# Patient Record
Sex: Male | Born: 2005 | Race: White | Hispanic: No | Marital: Single | Smoking: Never smoker
Health system: Southern US, Community
[De-identification: ages and names within clinical notes are randomized; demographics above are authoritative.]

## PROBLEM LIST (undated history)

## (undated) DIAGNOSIS — G43909 Migraine, unspecified, not intractable, without status migrainosus: Secondary | ICD-10-CM

## (undated) HISTORY — PX: APPENDECTOMY: SHX54

---
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
## 2023-01-28 DIAGNOSIS — F411 Generalized anxiety disorder: Secondary | ICD-10-CM | POA: Diagnosis not present

## 2023-01-28 DIAGNOSIS — F40248 Other situational type phobia: Secondary | ICD-10-CM | POA: Diagnosis not present

## 2023-01-28 DIAGNOSIS — F900 Attention-deficit hyperactivity disorder, predominantly inattentive type: Secondary | ICD-10-CM | POA: Diagnosis not present

## 2023-02-25 DIAGNOSIS — F40248 Other situational type phobia: Secondary | ICD-10-CM | POA: Diagnosis not present

## 2023-02-25 DIAGNOSIS — F411 Generalized anxiety disorder: Secondary | ICD-10-CM | POA: Diagnosis not present

## 2023-02-25 DIAGNOSIS — F900 Attention-deficit hyperactivity disorder, predominantly inattentive type: Secondary | ICD-10-CM | POA: Diagnosis not present

## 2023-03-19 DIAGNOSIS — F900 Attention-deficit hyperactivity disorder, predominantly inattentive type: Secondary | ICD-10-CM | POA: Diagnosis not present

## 2023-03-19 DIAGNOSIS — F411 Generalized anxiety disorder: Secondary | ICD-10-CM | POA: Diagnosis not present

## 2023-03-19 DIAGNOSIS — F40248 Other situational type phobia: Secondary | ICD-10-CM | POA: Diagnosis not present

## 2023-03-25 DIAGNOSIS — F40248 Other situational type phobia: Secondary | ICD-10-CM | POA: Diagnosis not present

## 2023-03-25 DIAGNOSIS — F411 Generalized anxiety disorder: Secondary | ICD-10-CM | POA: Diagnosis not present

## 2023-03-25 DIAGNOSIS — F900 Attention-deficit hyperactivity disorder, predominantly inattentive type: Secondary | ICD-10-CM | POA: Diagnosis not present

## 2023-05-03 DIAGNOSIS — F411 Generalized anxiety disorder: Secondary | ICD-10-CM | POA: Diagnosis not present

## 2023-05-03 DIAGNOSIS — F900 Attention-deficit hyperactivity disorder, predominantly inattentive type: Secondary | ICD-10-CM | POA: Diagnosis not present

## 2023-05-03 DIAGNOSIS — F40248 Other situational type phobia: Secondary | ICD-10-CM | POA: Diagnosis not present

## 2023-05-04 DIAGNOSIS — F40248 Other situational type phobia: Secondary | ICD-10-CM | POA: Diagnosis not present

## 2023-05-04 DIAGNOSIS — F900 Attention-deficit hyperactivity disorder, predominantly inattentive type: Secondary | ICD-10-CM | POA: Diagnosis not present

## 2023-05-04 DIAGNOSIS — F411 Generalized anxiety disorder: Secondary | ICD-10-CM | POA: Diagnosis not present

## 2023-05-05 ENCOUNTER — Other Ambulatory Visit: Payer: Self-pay

## 2023-05-05 ENCOUNTER — Encounter (HOSPITAL_BASED_OUTPATIENT_CLINIC_OR_DEPARTMENT_OTHER): Payer: Self-pay

## 2023-05-05 ENCOUNTER — Emergency Department (HOSPITAL_BASED_OUTPATIENT_CLINIC_OR_DEPARTMENT_OTHER)
Admission: EM | Admit: 2023-05-05 | Discharge: 2023-05-05 | Disposition: A | Discharge status: Home / Self Care | Payer: Federal, State, Local not specified - PPO | Attending: Emergency Medicine | Admitting: Emergency Medicine

## 2023-05-05 ENCOUNTER — Emergency Department (HOSPITAL_BASED_OUTPATIENT_CLINIC_OR_DEPARTMENT_OTHER): Payer: Federal, State, Local not specified - PPO

## 2023-05-05 DIAGNOSIS — S060X1A Concussion with loss of consciousness of 30 minutes or less, initial encounter: Secondary | ICD-10-CM | POA: Diagnosis not present

## 2023-05-05 DIAGNOSIS — W19XXXA Unspecified fall, initial encounter: Secondary | ICD-10-CM | POA: Insufficient documentation

## 2023-05-05 DIAGNOSIS — R55 Syncope and collapse: Secondary | ICD-10-CM | POA: Diagnosis not present

## 2023-05-05 DIAGNOSIS — R001 Bradycardia, unspecified: Secondary | ICD-10-CM | POA: Diagnosis not present

## 2023-05-05 DIAGNOSIS — S0990XA Unspecified injury of head, initial encounter: Secondary | ICD-10-CM | POA: Diagnosis not present

## 2023-05-05 LAB — BASIC METABOLIC PANEL
Anion gap: 9 (ref 5–15)
BUN: 22 mg/dL — ABNORMAL HIGH (ref 6–20)
CO2: 24 mmol/L (ref 22–32)
Calcium: 9.8 mg/dL (ref 8.9–10.3)
Chloride: 105 mmol/L (ref 98–111)
Creatinine, Ser: 1 mg/dL (ref 0.61–1.24)
GFR, Estimated: >60 mL/min (ref >60)
Glucose, Bld: 115 mg/dL — ABNORMAL HIGH (ref 70–99)
Potassium: 3.8 mmol/L (ref 3.5–5.1)
Sodium: 138 mmol/L (ref 135–145)

## 2023-05-05 LAB — CBC
HCT: 41.1 % (ref 39.0–52.0)
Hemoglobin: 14.3 g/dL (ref 13.0–17.0)
MCH: 29 pg (ref 26.0–34.0)
MCHC: 34.8 g/dL (ref 30.0–36.0)
MCV: 83.4 fL (ref 80.0–100.0)
Platelets: 191 10*3/uL (ref 150–400)
RBC: 4.93 MIL/uL (ref 4.22–5.81)
RDW: 12.5 % (ref 11.5–15.5)
WBC: 7.4 10*3/uL (ref 4.0–10.5)
nRBC: 0 % (ref 0.0–0.2)

## 2023-05-05 NOTE — ED Triage Notes (Signed)
Pt c/o concussion, states he "blacked out & hit head." Dad states that after dinner last night, he c/o CP/ "needed to burp." Dad found him slumped over utility sink, syncopal episode & hit head on floor. Approx 10sec of unresponsive, but pt w continued HA today

## 2023-05-05 NOTE — ED Provider Notes (Signed)
[place] EMERGENCY DEPARTMENT AT [facility] Provider Note   CSN: [id number] Arrival date & time: 05/05/23  1436     History No chief complaint on file.   Zachary Rose is a 17 y.o. male patient who presents to the emergency department today for further evaluation of a headache.  Patient states that he was eating dinner yesterday when he had a severe pain in his chest which lasted approximately 10 seconds.  He states the pain became severe to the point where he ended up losing consciousness briefly and he fell backwards and hit his head.  Since then he has been more somnolent than usual per the father at the bedside and complaining of a headache.  Patient has not had any additional episodes of chest pain later after the incident yesterday or today.  He denies nausea or vomiting.  HPI     Home Medications Prior to Admission medications   Medication Sig Start Date End Date Taking? Authorizing Provider  amitriptyline (ELAVIL) 25 MG tablet Take 1 tablet (25 mg total) by mouth at bedtime. 01/22/21   Keturah Shavers, MD  benzonatate (TESSALON) 100 MG capsule Take 1 capsule (100 mg total) by mouth every 8 (eight) hours. Patient not taking: No sig reported 09/08/20   Hall-Potvin, [place], PA-C  Coenzyme Q10 (COQ10) 150 MG CAPS Take once daily Patient not taking: Reported on 01/22/2021 10/29/20   Keturah Shavers, MD  dicyclomine (BENTYL) 10 MG capsule Take 10 mg by mouth 4 (four) times daily -  before meals and at bedtime. Patient not taking: Reported on 01/22/2021    [provider]  Magnesium Oxide 500 MG TABS Take 1 tablet (500 mg total) by mouth daily. Patient not taking: Reported on 01/22/2021 10/29/20   Keturah Shavers, MD  naproxen (NAPROSYN) 500 MG tablet Take 500 mg by mouth 2 (two) times daily with a meal. Patient not taking: Reported on 01/22/2021    [provider]  promethazine (PHENERGAN) 25 MG tablet Take 25 mg by mouth every 6 (six) hours as needed  for nausea or vomiting. Patient not taking: Reported on 01/22/2021    [provider]  rizatriptan (MAXALT) 10 MG tablet Take 10 mg by mouth as needed for migraine. May repeat in 2 hours if needed    [provider]  SUMAtriptan (IMITREX) 50 MG tablet Take 1 tablet with 400 mg of ibuprofen for moderate to severe headache, maximum 2 or 3 times a week 01/22/21   Keturah Shavers, MD  topiramate (TOPAMAX) 25 MG tablet 2 tablets twice daily for 2 weeks then 3 tablets twice daily 01/22/21   Keturah Shavers, MD  cetirizine (ZYRTEC ALLERGY) 10 MG tablet Take 1 tablet (10 mg total) by mouth daily. 09/08/20 10/22/20  Hall-Potvin, [place], PA-C  fluticasone (FLONASE) 50 MCG/ACT nasal spray Place 1 spray into both nostrils daily. 09/08/20 10/22/20  Hall-Potvin, [place], PA-C  omeprazole (PRILOSEC) 20 MG capsule Take 20 mg by mouth as needed.  05/01/12 09/08/20  [provider]  SUMAtriptan (IMITREX) 20 MG/ACT nasal spray Place 1 spray into 1 nostril at onset of migraine. May repeat in 2 hours if headache persists or recurs. Patient not taking: No sig reported 01/08/19 10/22/20  Elveria Rising, NP      Allergies    Patient has no known allergies.    Review of Systems   Review of Systems  All other systems reviewed and are negative.   Physical Exam Updated Vital Signs BP 125/79   Pulse Marland Kitchen)  51   Temp 98 F (36.7 C) (Oral)   Resp 19   SpO2 98%  Physical Exam Vitals and nursing note reviewed.  Constitutional:      Appearance: Normal appearance.  HENT:     Head: Normocephalic and atraumatic.  Eyes:     General:        Right eye: No discharge.        Left eye: No discharge.     Conjunctiva/sclera: Conjunctivae normal.  Pulmonary:     Effort: Pulmonary effort is normal.  Skin:    General: Skin is warm and dry.     Findings: No rash.  Neurological:     General: No focal deficit present.     Mental Status: He is alert.     Comments: Cranial nerves II through XII are  intact.  5/5 strength to the upper and lower extremities.  Normal sensation to the upper and lower extremities.  Extraocular movements are intact without any obvious entrapment.  Psychiatric:        Mood and Affect: Mood normal.        Behavior: Behavior normal.     ED Results / Procedures / Treatments   Labs (all labs ordered are listed, but only abnormal results are displayed) Labs Reviewed  BASIC METABOLIC PANEL - Abnormal; Notable for the following components:      Result Value   Glucose, Bld 115 (*)    BUN 22 (*)    All other components within normal limits  CBC    EKG None  Radiology CT Head Wo Contrast  Result Date: 05/05/2023 CLINICAL DATA:  Provided history: Head trauma, moderate/severe. Additional history provided: "Blackout" episode with head trauma. EXAM: CT HEAD WITHOUT CONTRAST TECHNIQUE: Contiguous axial images were obtained from the base of the skull through the vertex without intravenous contrast. RADIATION DOSE REDUCTION: This exam was performed according to the departmental dose-optimization program which includes automated exposure control, adjustment of the mA and/or kV according to patient size and/or use of iterative reconstruction technique. COMPARISON:  Brain MRI 12/08/2020. FINDINGS: Brain: Cerebral volume is normal. There is no acute intracranial hemorrhage. No demarcated cortical infarct. No extra-axial fluid collection. No evidence of an intracranial mass. No midline shift. Vascular: No hyperdense vessel. Skull: No calvarial fracture or aggressive osseous lesion. Sinuses/Orbits: No mass or acute finding within the imaged orbits. No significant paranasal sinus disease. IMPRESSION: No evidence of an acute intracranial abnormality. Electronically Signed   By: Jackey Loge D.O.   On: 05/05/2023 16:59    Procedures Procedures    Medications Ordered in ED Medications - No data to display  ED Course/ Medical Decision Making/ A&P Clinical Course as of 05/05/23  1728  Thu May 05, 2023  1724 On reevaluation, patient feeling about the same.  I educated the patient and father at the bedside about postconcussive syndrome including signs, symptoms, and overall treatment.  They expressed full understanding.  I am going to give the patient a school note for the next week.  Strict return precautions were discussed.  He is safe for discharge. [CF]  1725 CBC Normal.  [CF]  1725 Basic metabolic panel(!) Normal.  [CF]  1725 CT Head Wo Contrast I personally ordered and interpreted this study and do not see any evidence of intracranial pathology. I do agree with the radiologist interpretation.  [CF]    Clinical Course User Index [CF] Teressa Lower, PA-C   {   Click here for ABCD2, HEART and other calculators  Medical Decision Making ALFIO ALLSUP is a 17 y.o. male patient who presents to the emergency department today for further evaluation of possible concussion.  Signs and symptoms do certainly coincide with a concussion.  Considering the patient's age I still used PECARN which she was positive considering his overall age and somnolence per the father.  CT scan was normal.  Will treat conservatively for concussion including NSAIDs, limited activity at school including screen time.  Patient and father agreeable with plan.  Strict return precautions were discussed.  He is safe for discharge at this time.   Amount and/or Complexity of Data Reviewed Labs: ordered. Decision-making details documented in ED Course. Radiology: ordered. Decision-making details documented in ED Course.   Final Clinical Impression(s) / ED Diagnoses Final diagnoses:  Concussion with loss of consciousness of 30 minutes or less, initial encounter    Rx / DC Orders ED Discharge Orders     None         Teressa Lower, PA-C 05/05/23 1728    Rozelle Logan, DO 05/05/23 2237

## 2023-05-05 NOTE — ED Notes (Signed)
Pt verbalized understanding of d/c instructions, meds, and followup care. Denies questions. VSS, no distress noted. Steady gait to exit with all belongings.  ?

## 2023-05-05 NOTE — Discharge Instructions (Addendum)
As we discussed, this is likely a concussion from when you lost consciousness and hit your head.  This will resolve with time.  Please monitor your symptoms and adjust your daily activities accordingly.  You can take Tylenol and or ibuprofen for the headache symptoms.  Please return to the emergency department for any worsening symptoms.

## 2023-05-20 DIAGNOSIS — Z0000 Encounter for general adult medical examination without abnormal findings: Secondary | ICD-10-CM | POA: Diagnosis not present

## 2023-06-07 DIAGNOSIS — F411 Generalized anxiety disorder: Secondary | ICD-10-CM | POA: Diagnosis not present

## 2023-06-07 DIAGNOSIS — F40248 Other situational type phobia: Secondary | ICD-10-CM | POA: Diagnosis not present

## 2023-06-07 DIAGNOSIS — F900 Attention-deficit hyperactivity disorder, predominantly inattentive type: Secondary | ICD-10-CM | POA: Diagnosis not present

## 2023-06-21 DIAGNOSIS — F900 Attention-deficit hyperactivity disorder, predominantly inattentive type: Secondary | ICD-10-CM | POA: Diagnosis not present

## 2023-06-21 DIAGNOSIS — F40248 Other situational type phobia: Secondary | ICD-10-CM | POA: Diagnosis not present

## 2023-06-21 DIAGNOSIS — F411 Generalized anxiety disorder: Secondary | ICD-10-CM | POA: Diagnosis not present

## 2023-07-05 DIAGNOSIS — F411 Generalized anxiety disorder: Secondary | ICD-10-CM | POA: Diagnosis not present

## 2023-07-05 DIAGNOSIS — F40248 Other situational type phobia: Secondary | ICD-10-CM | POA: Diagnosis not present

## 2023-07-05 DIAGNOSIS — F900 Attention-deficit hyperactivity disorder, predominantly inattentive type: Secondary | ICD-10-CM | POA: Diagnosis not present

## 2023-08-02 DIAGNOSIS — F411 Generalized anxiety disorder: Secondary | ICD-10-CM | POA: Diagnosis not present

## 2023-08-02 DIAGNOSIS — F900 Attention-deficit hyperactivity disorder, predominantly inattentive type: Secondary | ICD-10-CM | POA: Diagnosis not present

## 2023-08-02 DIAGNOSIS — F40248 Other situational type phobia: Secondary | ICD-10-CM | POA: Diagnosis not present

## 2023-09-05 DIAGNOSIS — F40248 Other situational type phobia: Secondary | ICD-10-CM | POA: Diagnosis not present

## 2023-09-05 DIAGNOSIS — F411 Generalized anxiety disorder: Secondary | ICD-10-CM | POA: Diagnosis not present

## 2023-09-05 DIAGNOSIS — F900 Attention-deficit hyperactivity disorder, predominantly inattentive type: Secondary | ICD-10-CM | POA: Diagnosis not present

## 2023-09-23 DIAGNOSIS — F411 Generalized anxiety disorder: Secondary | ICD-10-CM | POA: Diagnosis not present

## 2023-10-07 DIAGNOSIS — F40248 Other situational type phobia: Secondary | ICD-10-CM | POA: Diagnosis not present

## 2023-10-07 DIAGNOSIS — F900 Attention-deficit hyperactivity disorder, predominantly inattentive type: Secondary | ICD-10-CM | POA: Diagnosis not present

## 2023-10-07 DIAGNOSIS — F411 Generalized anxiety disorder: Secondary | ICD-10-CM | POA: Diagnosis not present

## 2023-11-04 DIAGNOSIS — F411 Generalized anxiety disorder: Secondary | ICD-10-CM | POA: Diagnosis not present

## 2023-12-02 DIAGNOSIS — F411 Generalized anxiety disorder: Secondary | ICD-10-CM | POA: Diagnosis not present

## 2024-01-27 DIAGNOSIS — F411 Generalized anxiety disorder: Secondary | ICD-10-CM | POA: Diagnosis not present

## 2024-04-20 DIAGNOSIS — F411 Generalized anxiety disorder: Secondary | ICD-10-CM | POA: Diagnosis not present

## 2024-04-21 ENCOUNTER — Encounter: Payer: Self-pay | Admitting: Emergency Medicine

## 2024-04-21 ENCOUNTER — Ambulatory Visit
Admission: EM | Admit: 2024-04-21 | Discharge: 2024-04-21 | Disposition: A | Discharge status: Home / Self Care | Attending: Emergency Medicine | Admitting: Emergency Medicine

## 2024-04-21 ENCOUNTER — Ambulatory Visit

## 2024-04-21 DIAGNOSIS — R509 Fever, unspecified: Secondary | ICD-10-CM | POA: Diagnosis not present

## 2024-04-21 DIAGNOSIS — J069 Acute upper respiratory infection, unspecified: Secondary | ICD-10-CM

## 2024-04-21 DIAGNOSIS — R059 Cough, unspecified: Secondary | ICD-10-CM | POA: Diagnosis not present

## 2024-04-21 LAB — POC COVID19/FLU A&B COMBO
Covid Antigen, POC: NEGATIVE
Influenza A Antigen, POC: NEGATIVE
Influenza B Antigen, POC: NEGATIVE

## 2024-04-21 LAB — POCT RAPID STREP A (OFFICE): Rapid Strep A Screen: NEGATIVE

## 2024-04-21 MED ORDER — PROMETHAZINE-DM 6.25-15 MG/5ML PO SYRP: 2.5000 mL | ORAL_SOLUTION | Freq: Four times a day (QID) | ORAL | 0 refills | Status: AC | PRN

## 2024-04-21 NOTE — ED Provider Notes (Signed)
 [facility]    CSN: [id number] Arrival date & time: 04/21/24  0803      History   Chief Complaint Chief Complaint  Patient presents with   URI    HPI Zachary Rose is a 18 y.o. male.  Patient presents to the urgent care today with concerns of URI.  Reports that he has had a cough, sore throat, congestion for the last 7 days.  Also endorsing some off-and-on fevers and had had some resolution of fever but reports fever spiked once again last night.  No reported gastrointestinal symptoms.  Has been using DayQuil and NyQuil with some improvement in symptoms but no resolution.  No sick contacts as far as he is aware.   URI Presenting symptoms: cough     Past Medical History:  Diagnosis Date   Migraine     Patient Active Problem List   Diagnosis Date Noted   Acute appendicitis, uncomplicated 11/09/2017    Past Surgical History:  Procedure Laterality Date   APPENDECTOMY N/A    [employer] 10/29/2020   LAPAROSCOPIC APPENDECTOMY N/A 11/09/2017   Procedure: APPENDECTOMY LAPAROSCOPIC;  Surgeon: Chuckie Casimiro KIDD, MD;  Location: [facility] OR;  Service: Pediatrics;  Laterality: N/A;       Home Medications    Prior to Admission medications   Medication Sig Start Date End Date Taking? Authorizing Provider  promethazine -dextromethorphan (PROMETHAZINE -DM) 6.25-15 MG/5ML syrup Take 2.5 mLs by mouth 4 (four) times daily as needed for cough. 04/21/24  Yes Krissia Schreier A, PA-C  amitriptyline  (ELAVIL ) 25 MG tablet Take 1 tablet (25 mg total) by mouth at bedtime. 01/22/21   Corinthia Blossom, MD  benzonatate  (TESSALON ) 100 MG capsule Take 1 capsule (100 mg total) by mouth every 8 (eight) hours. Patient not taking: No sig reported 09/08/20   Hall-Potvin, [place], PA-C  Coenzyme Q10 (COQ10) 150 MG CAPS Take once daily Patient not taking: Reported on 01/22/2021 10/29/20   Corinthia Blossom, MD  dicyclomine  (BENTYL ) 10 MG capsule Take 10 mg by mouth 4 (four) times daily -  before meals and  at bedtime. Patient not taking: Reported on 01/22/2021    [provider]  Magnesium  Oxide 500 MG TABS Take 1 tablet (500 mg total) by mouth daily. Patient not taking: Reported on 01/22/2021 10/29/20   Corinthia Blossom, MD  naproxen  (NAPROSYN ) 500 MG tablet Take 500 mg by mouth 2 (two) times daily with a meal. Patient not taking: Reported on 01/22/2021    [provider]  promethazine  (PHENERGAN ) 25 MG tablet Take 25 mg by mouth every 6 (six) hours as needed for nausea or vomiting. Patient not taking: Reported on 01/22/2021    [provider]  rizatriptan (MAXALT) 10 MG tablet Take 10 mg by mouth as needed for migraine. May repeat in 2 hours if needed    [provider]  SUMAtriptan  (IMITREX ) 50 MG tablet Take 1 tablet with 400 mg of ibuprofen  for moderate to severe headache, maximum 2 or 3 times a week 01/22/21   Corinthia Blossom, MD  topiramate  (TOPAMAX ) 25 MG tablet 2 tablets twice daily for 2 weeks then 3 tablets twice daily 01/22/21   Corinthia Blossom, MD  cetirizine  (ZYRTEC  ALLERGY) 10 MG tablet Take 1 tablet (10 mg total) by mouth daily. 09/08/20 10/22/20  Hall-Potvin, [place], PA-C  fluticasone  (FLONASE ) 50 MCG/ACT nasal spray Place 1 spray into both nostrils daily. 09/08/20 10/22/20  Hall-Potvin, [place], PA-C  omeprazole (PRILOSEC) 20 MG capsule Take 20 mg by mouth as needed.  05/01/12 09/08/20  [provider]  SUMAtriptan  (IMITREX ) 20 MG/ACT nasal spray Place 1 spray into 1 nostril at onset of migraine. May repeat in 2 hours if headache persists or recurs. Patient not taking: No sig reported 01/08/19 10/22/20  Marianna City, NP    Family History Family History  Problem Relation Age of Onset   Migraines Mother    Migraines Father    Seizures Neg Hx    Depression Neg Hx    Anxiety disorder Neg Hx    Bipolar disorder Neg Hx    Schizophrenia Neg Hx    ADD / ADHD Neg Hx    Autism Neg Hx     Social History Social History   Tobacco Use    Smoking status: Never    Passive exposure: Never   Smokeless tobacco: Never  Vaping Use   Vaping status: Never Used  Substance Use Topics   Alcohol use: No   Drug use: No     Allergies   Patient has no known allergies.   Review of Systems Review of Systems  Respiratory:  Positive for cough.   All other systems reviewed and are negative.    Physical Exam Triage Vital Signs ED Triage Vitals  Encounter Vitals Group     BP 04/21/24 0825 (!) 141/89     Girls Systolic BP Percentile --      Girls Diastolic BP Percentile --      Boys Systolic BP Percentile --      Boys Diastolic BP Percentile --      Pulse Rate 04/21/24 0825 67     Resp 04/21/24 0825 16     Temp 04/21/24 0825 98.1 F (36.7 C)     Temp Source 04/21/24 0825 Oral     SpO2 04/21/24 0825 97 %     Weight 04/21/24 0823 175 lb (79.4 kg)     Height --      Head Circumference --      Peak Flow --      Pain Score 04/21/24 0823 4     Pain Loc --      Pain Education --      Exclude from Growth Chart --    No data found.  Updated Vital Signs BP (!) 141/89 (BP Location: Left Arm)   Pulse 67   Temp 98.1 F (36.7 C) (Oral)   Resp 16   Wt 175 lb (79.4 kg)   SpO2 97%   Visual Acuity Right Eye Distance:   Left Eye Distance:   Bilateral Distance:    Right Eye Near:   Left Eye Near:    Bilateral Near:     Physical Exam Vitals and nursing note reviewed.  Constitutional:      General: He is not in acute distress.    Appearance: He is well-developed.  HENT:     Head: Normocephalic and atraumatic.     Mouth/Throat:     Pharynx: Oropharynx is clear. Uvula midline. Posterior oropharyngeal erythema present. No pharyngeal swelling, oropharyngeal exudate or uvula swelling.     Tonsils: No tonsillar exudate or tonsillar abscesses. 1+ on the right. 1+ on the left.  Eyes:     Extraocular Movements: Extraocular movements intact.     Conjunctiva/sclera: Conjunctivae normal.     Pupils: Pupils are equal, round, and  reactive to light.  Cardiovascular:     Rate and Rhythm: Normal rate and regular rhythm.     Heart sounds: No murmur heard. Pulmonary:  Effort: Pulmonary effort is normal. No respiratory distress.     Breath sounds: Normal breath sounds.  Abdominal:     Palpations: Abdomen is soft.     Tenderness: There is no abdominal tenderness.  Musculoskeletal:        General: No swelling.     Cervical back: Neck supple.  Skin:    General: Skin is warm and dry.     Capillary Refill: Capillary refill takes less than 2 seconds.  Neurological:     Mental Status: He is alert.  Psychiatric:        Mood and Affect: Mood normal.      UC Treatments / Results  Labs (all labs ordered are listed, but only abnormal results are displayed) Labs Reviewed  POC COVID19/FLU A&B COMBO - Normal  POCT RAPID STREP A (OFFICE) - Normal    EKG   Radiology DG Chest 2 View Result Date: 04/21/2024 CLINICAL DATA:  Cough and fever. EXAM: CHEST - 2 VIEW COMPARISON:  09/16/2007 FINDINGS: The heart size and mediastinal contours are within normal limits. Both lungs are clear. The visualized skeletal structures are unremarkable. IMPRESSION: No active cardiopulmonary disease. Electronically Signed   By: Camellia Candle M.D.   On: 04/21/2024 09:08    Procedures Procedures (including critical care time)  Medications Ordered in UC Medications - No data to display  Initial Impression / Assessment and Plan / UC Course  I have reviewed the triage vital signs and the nursing notes.  Pertinent labs & imaging results that were available during my care of the patient were reviewed by me and considered in my medical decision making (see chart for details).     This patient presents to the UC for concern of URI.  Differential diagnosis includes COVID-19, influenza, strep, pneumonia, bronchitis   Lab Tests:  I Ordered, and personally interpreted labs.  The pertinent results include: Influenza AMB antigen is negative,  COVID antigen negative, rapid group A strep negative   Imaging Studies ordered:  I ordered imaging studies including chest x-ray I independently visualized and interpreted imaging which showed no acute cardiopulmonary process I agree with the radiologist interpretation   Problem List / UC Course:  Patient presents to the urgent care today with concerns of a URI.  Reports that he has been having cough, sore throat, congestion for the last 7 days.  Reports some off-and-on fevers that had appeared to have resolved but fever developed again last night.  Has been using over-the-counter medication such as DayQuil and NyQuil without significant proved in symptoms. On exam, patient has some oropharyngeal erythema but no obvious tonsillar exudate or tonsillar swelling.  Uvula is midline.  Lung sounds are primarily clear to auscultation bilaterally.  No abnormal heart sounds or dyspnea or developing heart murmur. Group A rapid strep is negative as well as influenza A, B, and COVID antigens.  Chest x-ray is also negative for any acute findings.  Suspect likely viral URI. Will prescribe cough medication and advise patient to take as prescribed for acute management at home. Encouraged close follow up with PCP. Return precautions discussed such as concerns for new or worsening symptoms. Discharged home in stable condition.   Social Determinants of Health:    Final Clinical Impressions(s) / UC Diagnoses   Final diagnoses:  Viral URI with cough     Discharge Instructions      You were seen at the urgent care today for concerns of a cough, sore throat, and fevers.  You thankfully  tested negative for COVID, influenza, strep, and your chest x-ray was negative with no signs of pneumonia or any other abnormal findings.  I do suspect likely having a viral infection that is causing these respiratory symptoms.  I am sending you home with a prescription cough medication he should take up to 4 times daily as  needed for cough.  For any concerns of new or worsening symptoms, return to the urgent care or seek evaluation in the emergency department.  Otherwise, please follow-up with your primary care provider.     ED Prescriptions     Medication Sig Dispense Auth. Provider   promethazine -dextromethorphan (PROMETHAZINE -DM) 6.25-15 MG/5ML syrup Take 2.5 mLs by mouth 4 (four) times daily as needed for cough. 118 mL Jamarie Mussa A, PA-C      PDMP not reviewed this encounter.   Claus Silvestro A, PA-C 04/21/24 [phone]

## 2024-04-21 NOTE — Discharge Instructions (Signed)
 You were seen at the urgent care today for concerns of a cough, sore throat, and fevers.  You thankfully tested negative for COVID, influenza, strep, and your chest x-ray was negative with no signs of pneumonia or any other abnormal findings.  I do suspect likely having a viral infection that is causing these respiratory symptoms.  I am sending you home with a prescription cough medication he should take up to 4 times daily as needed for cough.  For any concerns of new or worsening symptoms, return to the urgent care or seek evaluation in the emergency department.  Otherwise, please follow-up with your primary care provider.

## 2024-04-21 NOTE — ED Triage Notes (Signed)
 Pt presents c/o cough, sore throat, congestion x 6 days. Pt reports having tried DayQuil and NyQuil to help with sxs but the sxs are not improving. Pt denies emesis and diarrhea.
